# Patient Record
Sex: Male | Born: 1990 | Race: White | Hispanic: Yes | Marital: Single | Smoking: Current every day smoker
Health system: Southern US, Community
[De-identification: ages and names within clinical notes are randomized; demographics above are authoritative.]

## PROBLEM LIST (undated history)

## (undated) ENCOUNTER — Emergency Department (HOSPITAL_COMMUNITY): Admission: EM | Payer: Self-pay | Source: Home / Self Care

## (undated) DIAGNOSIS — R569 Unspecified convulsions: Secondary | ICD-10-CM

## (undated) DIAGNOSIS — J45909 Unspecified asthma, uncomplicated: Secondary | ICD-10-CM

## (undated) DIAGNOSIS — B1920 Unspecified viral hepatitis C without hepatic coma: Secondary | ICD-10-CM

---
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
## 2017-12-25 ENCOUNTER — Emergency Department (HOSPITAL_COMMUNITY)
Admission: EM | Admit: 2017-12-25 | Discharge: 2017-12-25 | Disposition: A | Discharge status: Home / Self Care | Payer: Self-pay | Attending: Emergency Medicine | Admitting: Emergency Medicine

## 2017-12-25 ENCOUNTER — Encounter (HOSPITAL_COMMUNITY): Payer: Self-pay | Admitting: Emergency Medicine

## 2017-12-25 ENCOUNTER — Emergency Department (HOSPITAL_COMMUNITY): Payer: Self-pay

## 2017-12-25 DIAGNOSIS — Y999 Unspecified external cause status: Secondary | ICD-10-CM | POA: Insufficient documentation

## 2017-12-25 DIAGNOSIS — Y939 Activity, unspecified: Secondary | ICD-10-CM | POA: Insufficient documentation

## 2017-12-25 DIAGNOSIS — S60221A Contusion of right hand, initial encounter: Secondary | ICD-10-CM | POA: Insufficient documentation

## 2017-12-25 DIAGNOSIS — Y929 Unspecified place or not applicable: Secondary | ICD-10-CM | POA: Insufficient documentation

## 2017-12-25 DIAGNOSIS — F17210 Nicotine dependence, cigarettes, uncomplicated: Secondary | ICD-10-CM | POA: Insufficient documentation

## 2017-12-25 MED ORDER — ACETAMINOPHEN 325 MG PO TABS: 650.0000 mg | ORAL_TABLET | Freq: Once | ORAL | Status: DC

## 2017-12-25 MED ORDER — ACETAMINOPHEN 500 MG PO TABS
1000.0000 mg | ORAL_TABLET | Freq: Once | ORAL | Status: AC
  Administered 2017-12-25: 1000 mg via ORAL
  Filled 2017-12-25: qty 2

## 2017-12-25 NOTE — Discharge Instructions (Signed)
X-rays of right hand, left forearm and elbow are negative. No fractures.   Take tylenol 1000 mg every 6-8 hours for pain. Rest. Ice. Start doing range of motion exercises with your hand to avoid stiffness. Follow up with hand surgery in 7-10 days if symptoms or range of motion does not normalize.
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
## 2018-01-25 ENCOUNTER — Other Ambulatory Visit: Payer: Self-pay

## 2018-01-25 ENCOUNTER — Encounter (HOSPITAL_COMMUNITY): Payer: Self-pay | Admitting: Emergency Medicine

## 2018-01-25 DIAGNOSIS — F17210 Nicotine dependence, cigarettes, uncomplicated: Secondary | ICD-10-CM | POA: Insufficient documentation

## 2018-01-25 DIAGNOSIS — J0140 Acute pansinusitis, unspecified: Secondary | ICD-10-CM | POA: Insufficient documentation

## 2018-01-25 NOTE — ED Triage Notes (Signed)
Pt reports having cough, congestion, and headache for the last week.

## 2018-01-25 NOTE — ED Notes (Signed)
Pt c/o headache, sore throat, productive cough with clear sputum, and intermittent dyspnea for around a week.

## 2018-01-26 ENCOUNTER — Emergency Department (HOSPITAL_COMMUNITY)
Admission: EM | Admit: 2018-01-26 | Discharge: 2018-01-26 | Disposition: A | Discharge status: Home / Self Care | Payer: Self-pay | Attending: Emergency Medicine | Admitting: Emergency Medicine

## 2018-01-26 DIAGNOSIS — J0140 Acute pansinusitis, unspecified: Secondary | ICD-10-CM

## 2018-01-26 MED ORDER — AMOXICILLIN-POT CLAVULANATE 875-125 MG PO TABS: 1.0000 | ORAL_TABLET | Freq: Two times a day (BID) | ORAL | 0 refills | Status: DC

## 2018-01-26 NOTE — ED Provider Notes (Signed)
[facility]-EMERGENCY DEPT Provider Note   CSN: [id number] Arrival date & time: 01/25/18  2122     History   Chief Complaint Chief Complaint  Patient presents with  . Cough  . Headache    HPI Adrian Rasmussen is a 27 y.o. male who presents the emergency department with 10 days of URI symptoms and sinus pressure and pain.  He has not taken anything for his symptoms.  He states that he felt like he had a fever earlier this week but did not take his temperature.  He denies nausea, vomiting.  HPI  History reviewed. No pertinent past medical history.  There are no active problems to display for this patient.   History reviewed. No pertinent surgical history.     Home Medications    Prior to Admission medications   Not on File    Family History History reviewed. No pertinent family history.  Social History Social History   Tobacco Use  . Smoking status: Current Every Day Smoker    Types: Cigarettes  . Smokeless tobacco: Never Used  Substance Use Topics  . Alcohol use: No    Frequency: Never  . Drug use: Not on file     Allergies   Patient has no known allergies.   Review of Systems Review of Systems  Ten systems reviewed and are negative for acute change, except as noted in the HPI.   Physical Exam Updated Vital Signs BP 110/81 (BP Location: Left Arm)   Pulse 74   Temp 98.3 F (36.8 C) (Oral)   Resp 18   Ht 5\' 6"  (1.676 m)   Wt 65.9 kg (145 lb 4.8 oz)   SpO2 98%   BMI 23.45 kg/m   Physical Exam  Constitutional: He appears well-developed and well-nourished. No distress.  HENT:  Head: Normocephalic and atraumatic.  Eyes: Conjunctivae are normal. No scleral icterus.  Neck: Normal range of motion. Neck supple.  Cardiovascular: Normal rate, regular rhythm and normal heart sounds.  Pulmonary/Chest: Effort normal and breath sounds normal. No respiratory distress.  Abdominal: Soft. There is no tenderness.  Musculoskeletal: He  exhibits no edema.  Neurological: He is alert.  Skin: Skin is warm and dry. He is not diaphoretic.  Psychiatric: His behavior is normal.  Nursing note and vitals reviewed.    ED Treatments / Results  Labs (all labs ordered are listed, but only abnormal results are displayed) Labs Reviewed - No data to display  EKG  EKG Interpretation None       Radiology No results found.  Procedures Procedures (including critical care time)  Medications Ordered in ED Medications - No data to display   Initial Impression / Assessment and Plan / ED Course  I have reviewed the triage vital signs and the nursing notes.  Pertinent labs & imaging results that were available during my care of the patient were reviewed by me and considered in my medical decision making (see chart for details).     Patient with days of persistent sinus pressure pain and URI symptoms patient will be discharged with Augmentin, over-the-counter medications for symptomatic treatment.  He appears appropriate for discharge at this time Final Clinical Impressions(s) / ED Diagnoses   Final diagnoses:  Acute non-recurrent pansinusitis    ED Discharge Orders        Ordered    amoxicillin-clavulanate (AUGMENTIN) 875-125 MG tablet  2 times daily     01/26/18 0159       Arthor CaptainHarris, Rushi Chasen, PA-C  01/26/18 1610    Dione Booze, MD 01/26/18 [phone]

## 2018-01-26 NOTE — Discharge Instructions (Signed)
Contact a health care provider if: °You have a fever. °Your symptoms get worse. °Your symptoms do not improve within 10 days. °Get help right away if: °You have a severe headache. °You have persistent vomiting. °You have pain or swelling around your face or eyes. °You have vision problems. °You develop confusion. °Your neck is stiff. °You have trouble breathing. °

## 2018-02-17 ENCOUNTER — Emergency Department (HOSPITAL_COMMUNITY): Payer: Self-pay

## 2018-02-17 ENCOUNTER — Encounter (HOSPITAL_COMMUNITY): Payer: Self-pay | Admitting: Family Medicine

## 2018-02-17 DIAGNOSIS — F17210 Nicotine dependence, cigarettes, uncomplicated: Secondary | ICD-10-CM | POA: Insufficient documentation

## 2018-02-17 DIAGNOSIS — Y939 Activity, unspecified: Secondary | ICD-10-CM | POA: Insufficient documentation

## 2018-02-17 DIAGNOSIS — M79601 Pain in right arm: Secondary | ICD-10-CM | POA: Insufficient documentation

## 2018-02-17 DIAGNOSIS — Y999 Unspecified external cause status: Secondary | ICD-10-CM | POA: Insufficient documentation

## 2018-02-17 DIAGNOSIS — J45909 Unspecified asthma, uncomplicated: Secondary | ICD-10-CM | POA: Insufficient documentation

## 2018-02-17 DIAGNOSIS — Y929 Unspecified place or not applicable: Secondary | ICD-10-CM | POA: Insufficient documentation

## 2018-02-17 DIAGNOSIS — M25531 Pain in right wrist: Secondary | ICD-10-CM | POA: Insufficient documentation
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
## 2018-02-18 ENCOUNTER — Emergency Department (HOSPITAL_COMMUNITY)
Admission: EM | Admit: 2018-02-18 | Discharge: 2018-02-18 | Disposition: A | Discharge status: Home / Self Care | Payer: Self-pay | Attending: Emergency Medicine | Admitting: Emergency Medicine

## 2018-02-18 DIAGNOSIS — M79601 Pain in right arm: Secondary | ICD-10-CM

## 2018-02-18 DIAGNOSIS — M25531 Pain in right wrist: Secondary | ICD-10-CM

## 2018-02-18 HISTORY — DX: Unspecified asthma, uncomplicated: J45.909

## 2018-02-18 MED ORDER — METHYLPREDNISOLONE 4 MG PO TBPK: ORAL_TABLET | ORAL | 0 refills | Status: AC

## 2018-02-18 MED ORDER — NAPROXEN 375 MG PO TABS: 375.0000 mg | ORAL_TABLET | Freq: Two times a day (BID) | ORAL | 0 refills | Status: DC

## 2018-02-18 MED ORDER — NAPROXEN 500 MG PO TABS
500.0000 mg | ORAL_TABLET | Freq: Once | ORAL | Status: AC
  Administered 2018-02-18: 500 mg via ORAL
  Filled 2018-02-18: qty 1

## 2018-02-18 MED ORDER — METHOCARBAMOL 500 MG PO TABS: 500.0000 mg | ORAL_TABLET | Freq: Two times a day (BID) | ORAL | 0 refills | Status: DC
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
## 2018-02-18 NOTE — ED Notes (Signed)
Bed: WTR9 Expected date:  Expected time:  Means of arrival:  Comments: 

## 2018-02-18 NOTE — Discharge Instructions (Signed)
Your x-rays were normal.  Your symptoms may be related to like a radiculopathy of your neck and shoulder or may be due to carpal tunnel.  Wear the splint for comfort.  Please rest, ice, compress and elevated the affected body part to help with swelling and pain. Please the the robaxin for muscle relaxation. This medication will make you drowsy so avoid situation that could place you in danger. Please take the Naproxen as prescribed for pain. Do not take any additional NSAIDs including Motrin, Aleve, Ibuprofen, Advil.  Follow with orthopedic doctor if symptoms not improving return to ED with any worsening symptoms.

## 2018-05-09 ENCOUNTER — Emergency Department (HOSPITAL_COMMUNITY)
Admission: EM | Admit: 2018-05-09 | Discharge: 2018-05-10 | Disposition: A | Discharge status: Home / Self Care | Payer: Self-pay | Attending: Emergency Medicine | Admitting: Emergency Medicine

## 2018-05-09 ENCOUNTER — Encounter (HOSPITAL_COMMUNITY): Payer: Self-pay

## 2018-05-09 ENCOUNTER — Other Ambulatory Visit: Payer: Self-pay

## 2018-05-09 DIAGNOSIS — F17210 Nicotine dependence, cigarettes, uncomplicated: Secondary | ICD-10-CM | POA: Insufficient documentation

## 2018-05-09 DIAGNOSIS — Z79899 Other long term (current) drug therapy: Secondary | ICD-10-CM | POA: Insufficient documentation

## 2018-05-09 DIAGNOSIS — R52 Pain, unspecified: Secondary | ICD-10-CM

## 2018-05-09 DIAGNOSIS — J45909 Unspecified asthma, uncomplicated: Secondary | ICD-10-CM | POA: Insufficient documentation

## 2018-05-09 DIAGNOSIS — M791 Myalgia, unspecified site: Secondary | ICD-10-CM | POA: Insufficient documentation

## 2018-05-09 HISTORY — DX: Unspecified convulsions: R56.9

## 2018-05-09 HISTORY — DX: Unspecified viral hepatitis C without hepatic coma: B19.20

## 2018-05-09 LAB — URINALYSIS, ROUTINE W REFLEX MICROSCOPIC
Bilirubin Urine: NEGATIVE
GLUCOSE, UA: NEGATIVE mg/dL
Hgb urine dipstick: NEGATIVE
Ketones, ur: 5 mg/dL — AB
LEUKOCYTES UA: NEGATIVE
Nitrite: NEGATIVE
PH: 6 (ref 5.0–8.0)
PROTEIN: NEGATIVE mg/dL
Specific Gravity, Urine: 1.021 (ref 1.005–1.030)

## 2018-05-09 LAB — COMPREHENSIVE METABOLIC PANEL
ALT: 70 U/L — ABNORMAL HIGH (ref 17–63)
AST: 48 U/L — ABNORMAL HIGH (ref 15–41)
Albumin: 4.8 g/dL (ref 3.5–5.0)
Alkaline Phosphatase: 70 U/L (ref 38–126)
Anion gap: 6 (ref 5–15)
BUN: 7 mg/dL (ref 6–20)
CO2: 28 mmol/L (ref 22–32)
Calcium: 9.2 mg/dL (ref 8.9–10.3)
Chloride: 107 mmol/L (ref 101–111)
Creatinine, Ser: 0.73 mg/dL (ref 0.61–1.24)
Glucose, Bld: 94 mg/dL (ref 65–99)
POTASSIUM: 3.4 mmol/L — AB (ref 3.5–5.1)
Sodium: 141 mmol/L (ref 135–145)
Total Bilirubin: 0.8 mg/dL (ref 0.3–1.2)
Total Protein: 8 g/dL (ref 6.5–8.1)

## 2018-05-09 LAB — CBC
HEMATOCRIT: 44.9 % (ref 39.0–52.0)
Hemoglobin: 15.2 g/dL (ref 13.0–17.0)
MCH: 30.3 pg (ref 26.0–34.0)
MCHC: 33.9 g/dL (ref 30.0–36.0)
MCV: 89.6 fL (ref 78.0–100.0)
PLATELETS: 221 10*3/uL (ref 150–400)
RBC: 5.01 MIL/uL (ref 4.22–5.81)
RDW: 13.6 % (ref 11.5–15.5)
WBC: 7.2 10*3/uL (ref 4.0–10.5)

## 2018-05-09 NOTE — ED Notes (Signed)
Called Pt in lobby for vital recheck, no response in lobby x1. 

## 2018-05-09 NOTE — ED Notes (Signed)
Pt stated he is complaining about body aches "all over especially on the right side."

## 2018-05-09 NOTE — ED Triage Notes (Addendum)
Patient c/o body aches and cramping x 2-3 weeks. Patient  States he has been having unprotected sex and needs an STD check. Patient also reports that he has Hep C and needs his liver checked.

## 2018-05-10 MED ORDER — ACETAMINOPHEN 500 MG PO TABS
1000.0000 mg | ORAL_TABLET | Freq: Once | ORAL | Status: AC
  Administered 2018-05-10: 1000 mg via ORAL
  Filled 2018-05-10: qty 2

## 2018-05-10 MED ORDER — IBUPROFEN 800 MG PO TABS
800.0000 mg | ORAL_TABLET | Freq: Once | ORAL | Status: AC
  Administered 2018-05-10: 800 mg via ORAL
  Filled 2018-05-10: qty 1

## 2018-05-10 MED ORDER — GI COCKTAIL ~~LOC~~
30.0000 mL | Freq: Once | ORAL | Status: AC
  Administered 2018-05-10: 30 mL via ORAL
  Filled 2018-05-10: qty 30

## 2018-05-10 NOTE — ED Provider Notes (Signed)
[facility]-EMERGENCY DEPT Provider Note   CSN: [id number] Arrival date & time: 05/09/18  1802     History   Chief Complaint Chief Complaint  Patient presents with  . Generalized Body Aches  . Abdominal Cramping  . STD check    HPI Adrian Rasmussen is a 27 y.o. male.  The history is provided by the patient.  Abdominal Cramping  This is a chronic problem. The current episode started more than 1 week ago. The problem occurs constantly. The problem has not changed since onset.Pertinent negatives include no chest pain, no headaches and no shortness of breath. Nothing aggravates the symptoms. Nothing relieves the symptoms. He has tried nothing for the symptoms. The treatment provided no relief.  Muscle Pain  This is a chronic problem. The current episode started more than 1 week ago. The problem occurs constantly. The problem has not changed since onset.Pertinent negatives include no chest pain, no headaches and no shortness of breath. Nothing aggravates the symptoms. Nothing relieves the symptoms. He has tried nothing for the symptoms. The treatment provided no relief.  Penile penile discharge no dysuria would like HEP C testing, though he has Hep C.  No new partners.    Past Medical History:  Diagnosis Date  . Asthma   . Hepatitis C   . Seizures (HCC)     There are no active problems to display for this patient.   History reviewed. No pertinent surgical history.      Home Medications    Prior to Admission medications   Medication Sig Start Date End Date Taking? Authorizing Provider  acetaminophen (TYLENOL) 500 MG tablet Take 500 mg by mouth every 6 (six) hours as needed for moderate pain.   Yes [provider]  ibuprofen (ADVIL,MOTRIN) 200 MG tablet Take 200 mg by mouth every 6 (six) hours as needed for moderate pain.   Yes [provider]  methylPREDNISolone (MEDROL DOSEPAK) 4 MG TBPK tablet Take as directed Patient not taking:  Reported on 05/10/2018 02/18/18   Demetrios LollLeaphart, Kenneth T, PA-C  naproxen (NAPROSYN) 375 MG tablet Take 1 tablet (375 mg total) by mouth 2 (two) times daily. 02/18/18   Rise MuLeaphart, Kenneth T, PA-C    Family History Family History  Problem Relation Age of Onset  . Thyroid disease Mother   . Seizures Mother     Social History Social History   Tobacco Use  . Smoking status: Current Every Day Smoker    Packs/day: 0.10    Types: Cigarettes  . Smokeless tobacco: Never Used  Substance Use Topics  . Alcohol use: Yes    Frequency: Never    Comment: occasionally  . Drug use: No     Allergies   Patient has no known allergies.   Review of Systems Review of Systems  Constitutional: Negative for fever.  HENT: Negative for congestion, sore throat, tinnitus, trouble swallowing and voice change.   Respiratory: Negative for shortness of breath.   Cardiovascular: Negative for chest pain, palpitations and leg swelling.  Gastrointestinal: Negative for anal bleeding, blood in stool, constipation, diarrhea and vomiting.  Genitourinary: Negative for discharge, dysuria, frequency, penile swelling, scrotal swelling, testicular pain and urgency.  Musculoskeletal: Positive for myalgias. Negative for gait problem and joint swelling.  Skin: Negative for rash and wound.  Neurological: Negative for headaches.  All other systems reviewed and are negative.    Physical Exam Updated Vital Signs BP (!) 142/104 (BP Location: Right Arm)   Pulse 68   Temp  98.4 F (36.9 C) (Oral)   Resp 18   Ht 5\' 6"  (1.676 m)   Wt 63.3 kg (139 lb 9.6 oz)   SpO2 99%   BMI 22.53 kg/m   Physical Exam  Constitutional: He is oriented to person, place, and time. He appears well-developed and well-nourished. No distress.  HENT:  Head: Normocephalic and atraumatic.  Nose: Nose normal.  Mouth/Throat: No oropharyngeal exudate.  Sleeping soundly upon entrance to the room  Eyes: Pupils are equal, round, and reactive to light.  Conjunctivae are normal.  Neck: Normal range of motion. Neck supple.  Cardiovascular: Normal rate, regular rhythm, normal heart sounds and intact distal pulses.  No murmur heard. Pulmonary/Chest: Effort normal and breath sounds normal. No stridor. No respiratory distress. He has no wheezes. He has no rales.  Abdominal: Soft. Bowel sounds are normal. He exhibits no distension and no mass. There is no tenderness. There is no rebound and no guarding. No hernia.  Musculoskeletal: Normal range of motion. He exhibits no edema, tenderness or deformity.  Neurological: He is alert and oriented to person, place, and time. He displays normal reflexes.  Skin: Skin is warm and dry. Capillary refill takes less than 2 seconds.  No splinter hemorrhages janeway lesions or osler nodes.    Psychiatric: He has a normal mood and affect.  Nursing note and vitals reviewed.    ED Treatments / Results  Labs (all labs ordered are listed, but only abnormal results are displayed) Results for orders placed or performed during the hospital encounter of 05/09/18  Comprehensive metabolic panel  Result Value Ref Range   Sodium 141 135 - 145 mmol/L   Potassium 3.4 (L) 3.5 - 5.1 mmol/L   Chloride 107 101 - 111 mmol/L   CO2 28 22 - 32 mmol/L   Glucose, Bld 94 65 - 99 mg/dL   BUN 7 6 - 20 mg/dL   Creatinine, Ser 1.61 0.61 - 1.24 mg/dL   Calcium 9.2 8.9 - 09.6 mg/dL   Total Protein 8.0 6.5 - 8.1 g/dL   Albumin 4.8 3.5 - 5.0 g/dL   AST 48 (H) 15 - 41 U/L   ALT 70 (H) 17 - 63 U/L   Alkaline Phosphatase 70 38 - 126 U/L   Total Bilirubin 0.8 0.3 - 1.2 mg/dL   GFR calc non Af Amer >60 >60 mL/min   GFR calc Af Amer >60 >60 mL/min   Anion gap 6 5 - 15  CBC  Result Value Ref Range   WBC 7.2 4.0 - 10.5 K/uL   RBC 5.01 4.22 - 5.81 MIL/uL   Hemoglobin 15.2 13.0 - 17.0 g/dL   HCT 04.5 40.9 - 81.1 %   MCV 89.6 78.0 - 100.0 fL   MCH 30.3 26.0 - 34.0 pg   MCHC 33.9 30.0 - 36.0 g/dL   RDW 91.4 78.2 - 95.6 %   Platelets  221 150 - 400 K/uL  Urinalysis, Routine w reflex microscopic  Result Value Ref Range   Color, Urine YELLOW YELLOW   APPearance HAZY (A) CLEAR   Specific Gravity, Urine 1.021 1.005 - 1.030   pH 6.0 5.0 - 8.0   Glucose, UA NEGATIVE NEGATIVE mg/dL   Hgb urine dipstick NEGATIVE NEGATIVE   Bilirubin Urine NEGATIVE NEGATIVE   Ketones, ur 5 (A) NEGATIVE mg/dL   Protein, ur NEGATIVE NEGATIVE mg/dL   Nitrite NEGATIVE NEGATIVE   Leukocytes, UA NEGATIVE NEGATIVE   No results found.  EKG None  Radiology No results found.  Procedures Procedures (including critical care time)  Medications Ordered in ED Medications  gi cocktail (Maalox,Lidocaine,Donnatal) (30 mLs Oral Given 05/10/18 0056)  ibuprofen (ADVIL,MOTRIN) tablet 800 mg (800 mg Oral Given 05/10/18 0056)  acetaminophen (TYLENOL) tablet 1,000 mg (1,000 mg Oral Given 05/10/18 0056)      Final Clinical Impressions(s) / ED Diagnoses   Final diagnoses:  Body aches   Exam is benign and reassuring he will need to follow up with his PMD for the tests he would like.  No signs of dehydration.  Well appearing.     Return for weakness, numbness, changes in vision or speech, fevers >100.4 unrelieved by medication, shortness of breath, intractable vomiting, or diarrhea, abdominal pain, Inability to tolerate liquids or food, cough, altered mental status or any concerns. No signs of systemic illness or infection. The patient is nontoxic-appearing on exam and vital signs are within normal limits.   I have reviewed the triage vital signs and the nursing notes. Pertinent labs &imaging results that were available during my care of the patient were reviewed by me and considered in my medical decision making (see chart for details).  After history, exam, and medical workup I feel the patient has been appropriately medically screened and is safe for discharge home. Pertinent diagnoses were discussed with the patient. Patient was given return  precautions.    ED Discharge Orders    None       Taisley Mordan, MD 05/10/18 1610

## 2018-05-10 NOTE — ED Notes (Signed)
Tolerated po challenge well. 

## 2018-05-12 LAB — GC/CHLAMYDIA PROBE AMP (~~LOC~~) NOT AT ARMC
Chlamydia: POSITIVE — AB
Neisseria Gonorrhea: NEGATIVE

## 2018-05-24 LAB — GLUCOSE, POCT (MANUAL RESULT ENTRY): POC Glucose: 85 mg/dl (ref 70–99)

## 2018-08-27 ENCOUNTER — Emergency Department (HOSPITAL_COMMUNITY)
Admission: EM | Admit: 2018-08-27 | Discharge: 2018-08-27 | Disposition: A | Discharge status: Home / Self Care | Payer: Self-pay | Attending: Emergency Medicine | Admitting: Emergency Medicine

## 2018-08-27 ENCOUNTER — Encounter (HOSPITAL_COMMUNITY): Payer: Self-pay | Admitting: Emergency Medicine

## 2018-08-27 ENCOUNTER — Emergency Department (HOSPITAL_COMMUNITY): Payer: Self-pay

## 2018-08-27 ENCOUNTER — Other Ambulatory Visit: Payer: Self-pay

## 2018-08-27 DIAGNOSIS — F17210 Nicotine dependence, cigarettes, uncomplicated: Secondary | ICD-10-CM | POA: Insufficient documentation

## 2018-08-27 DIAGNOSIS — M542 Cervicalgia: Secondary | ICD-10-CM | POA: Insufficient documentation

## 2018-08-27 DIAGNOSIS — J45909 Unspecified asthma, uncomplicated: Secondary | ICD-10-CM | POA: Insufficient documentation

## 2018-08-27 DIAGNOSIS — Y929 Unspecified place or not applicable: Secondary | ICD-10-CM | POA: Insufficient documentation

## 2018-08-27 DIAGNOSIS — S060X1A Concussion with loss of consciousness of 30 minutes or less, initial encounter: Secondary | ICD-10-CM | POA: Insufficient documentation

## 2018-08-27 DIAGNOSIS — Y939 Activity, unspecified: Secondary | ICD-10-CM | POA: Insufficient documentation

## 2018-08-27 DIAGNOSIS — Y999 Unspecified external cause status: Secondary | ICD-10-CM | POA: Insufficient documentation

## 2018-08-27 MED ORDER — TETANUS-DIPHTH-ACELL PERTUSSIS 5-2.5-18.5 LF-MCG/0.5 IM SUSP
0.5000 mL | Freq: Once | INTRAMUSCULAR | Status: DC
  Filled 2018-08-27: qty 0.5

## 2018-08-27 MED ORDER — NAPROXEN 375 MG PO TABS: 375.0000 mg | ORAL_TABLET | Freq: Two times a day (BID) | ORAL | 0 refills | Status: AC

## 2018-08-27 MED ORDER — ACETAMINOPHEN 500 MG PO TABS
1000.0000 mg | ORAL_TABLET | Freq: Once | ORAL | Status: AC
  Administered 2018-08-27: 1000 mg via ORAL
  Filled 2018-08-27: qty 2

## 2018-08-27 MED ORDER — NAPROXEN 250 MG PO TABS
500.0000 mg | ORAL_TABLET | Freq: Once | ORAL | Status: AC
  Administered 2018-08-27: 500 mg via ORAL
  Filled 2018-08-27: qty 2

## 2018-08-27 NOTE — ED Notes (Signed)
Patient verbalizes understanding of discharge instructions. Opportunity for questioning and answers were provided. Armband removed by staff, pt discharged from ED.  

## 2018-08-27 NOTE — ED Notes (Signed)
Patient transported to CT 

## 2018-08-27 NOTE — Discharge Instructions (Addendum)
Contact a health care provider if:  Your symptoms get worse.  You have new symptoms.  You continue to have symptoms for more than 2 weeks.  Get help right away if:  You have severe or worsening headaches.  You have weakness or numbness in any part of your body.  Your coordination gets worse.  You vomit repeatedly.  You are sleepier.  The pupil of one eye is larger than the other.  You have convulsions or a seizure.  Your speech is slurred.  Your fatigue, confusion, or irritability gets worse.  You cannot recognize people or places.  You have neck pain.  It is difficult to wake you up.  You have unusual behavior changes.  You lose consciousness.
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
## 2020-01-20 ENCOUNTER — Other Ambulatory Visit: Payer: Self-pay

## 2020-01-20 DIAGNOSIS — Z20822 Contact with and (suspected) exposure to covid-19: Secondary | ICD-10-CM

## 2020-01-21 LAB — NOVEL CORONAVIRUS, NAA: SARS-CoV-2, NAA: NOT DETECTED
